# Patient Record
Sex: Male | Born: 1990 | Hispanic: No | Marital: Single | Smoking: Never smoker
Health system: Southern US, Community
[De-identification: ages and names within clinical notes are randomized; demographics above are authoritative.]

## PROBLEM LIST (undated history)

## (undated) ENCOUNTER — Inpatient Hospital Stay (HOSPITAL_COMMUNITY): Payer: No Typology Code available for payment source

## (undated) DIAGNOSIS — I10 Essential (primary) hypertension: Secondary | ICD-10-CM

---

## 2018-03-18 ENCOUNTER — Emergency Department (HOSPITAL_BASED_OUTPATIENT_CLINIC_OR_DEPARTMENT_OTHER)
Admission: EM | Admit: 2018-03-18 | Discharge: 2018-03-18 | Disposition: A | Discharge status: Home / Self Care | Payer: No Typology Code available for payment source | Attending: Emergency Medicine | Admitting: Emergency Medicine

## 2018-03-18 ENCOUNTER — Emergency Department (HOSPITAL_BASED_OUTPATIENT_CLINIC_OR_DEPARTMENT_OTHER): Payer: No Typology Code available for payment source

## 2018-03-18 ENCOUNTER — Encounter (HOSPITAL_BASED_OUTPATIENT_CLINIC_OR_DEPARTMENT_OTHER): Payer: Self-pay | Admitting: Emergency Medicine

## 2018-03-18 ENCOUNTER — Other Ambulatory Visit: Payer: Self-pay

## 2018-03-18 DIAGNOSIS — M542 Cervicalgia: Secondary | ICD-10-CM | POA: Insufficient documentation

## 2018-03-18 DIAGNOSIS — R51 Headache: Secondary | ICD-10-CM | POA: Diagnosis not present

## 2018-03-18 DIAGNOSIS — R0789 Other chest pain: Secondary | ICD-10-CM | POA: Insufficient documentation

## 2018-03-18 DIAGNOSIS — M545 Low back pain, unspecified: Secondary | ICD-10-CM

## 2018-03-18 MED ORDER — ACETAMINOPHEN 500 MG PO TABS
ORAL_TABLET | ORAL | Status: AC
  Administered 2018-03-18: 1000 mg
  Filled 2018-03-18: qty 2

## 2018-03-18 MED ORDER — CYCLOBENZAPRINE HCL 10 MG PO TABS: 10.0000 mg | ORAL_TABLET | Freq: Two times a day (BID) | ORAL | 0 refills | Status: DC | PRN

## 2018-03-18 NOTE — Discharge Instructions (Signed)
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
## 2022-06-16 ENCOUNTER — Encounter (HOSPITAL_COMMUNITY): Payer: Self-pay | Admitting: Emergency Medicine

## 2022-06-16 ENCOUNTER — Ambulatory Visit (HOSPITAL_COMMUNITY)
Admission: EM | Admit: 2022-06-16 | Discharge: 2022-06-16 | Disposition: A | Discharge status: Home / Self Care | Payer: Self-pay | Attending: Internal Medicine | Admitting: Internal Medicine

## 2022-06-16 DIAGNOSIS — I10 Essential (primary) hypertension: Secondary | ICD-10-CM

## 2022-06-16 MED ORDER — AMLODIPINE BESYLATE 5 MG PO TABS: 5.0000 mg | ORAL_TABLET | Freq: Every day | ORAL | 0 refills | Status: DC

## 2022-06-16 NOTE — ED Triage Notes (Signed)
Pt reports a headache and feeling dizzy x 6 months. States he feels like symptoms are related to having high blood pressure due to hands trembling.

## 2022-06-16 NOTE — ED Provider Notes (Signed)
[facility]    CSN: [id number] Arrival date & time: 06/16/22  1626      History   Chief Complaint Chief Complaint  Patient presents with   Headache   Dizziness    HPI Chad Barrett is a 31 y.o. male presents to UC today with complaint of headache and lightheadedness.  He reports this is occurred daily for the past 6 months.  The headache is located all over his head.  He describes the pain as pressure.  He describes the lightheadedness as a sense of imbalance, not that the room is spinning.  He denies vision changes, sinus symptoms, chest pain or shortness of breath.  His BP today is 161/77.  He has never been diagnosed with HTN in the past but reports it does run in his family.  HPI  History reviewed. No pertinent past medical history.  There are no problems to display for this patient.   History reviewed. No pertinent surgical history.     Home Medications    Prior to Admission medications   Medication Sig Start Date End Date Taking? Authorizing Provider  amLODipine (NORVASC) 5 MG tablet Take 1 tablet (5 mg total) by mouth daily. 06/16/22  Yes Stephaine Breshears, Salvadore Oxford, NP  cyclobenzaprine (FLEXERIL) 10 MG tablet Take 1 tablet (10 mg total) by mouth 2 (two) times daily as needed for muscle spasms. 03/18/18   Jeanie Sewer, PA-C    Family History History reviewed. No pertinent family history.  Social History Social History   Tobacco Use   Smoking status: Never   Smokeless tobacco: Never  Substance Use Topics   Alcohol use: Yes    Alcohol/week: 3.0 standard drinks of alcohol    Types: 3 Cans of beer per week   Drug use: Never     Allergies   Patient has no known allergies.   Review of Systems Review of Systems  Constitutional:  Negative for diaphoresis.  HENT:  Negative for sinus pressure and sinus pain.   Eyes:  Negative for visual disturbance.  Respiratory:  Negative for chest tightness and shortness of breath.   Cardiovascular:  Negative for  chest pain.  Neurological:  Positive for light-headedness and headaches. Negative for speech difficulty and weakness.     Physical Exam Triage Vital Signs ED Triage Vitals  Enc Vitals Group     BP 06/16/22 1653 (!) 161/77     Pulse Rate 06/16/22 1653 96     Resp 06/16/22 1653 18     Temp 06/16/22 1653 98.4 F (36.9 C)     Temp Source 06/16/22 1653 Oral     SpO2 06/16/22 1653 98 %     Weight --      Height --      Head Circumference --      Peak Flow --      Pain Score 06/16/22 1655 7     Pain Loc --      Pain Edu? --      Excl. in GC? --    No data found.  Updated Vital Signs BP (!) 161/77 (BP Location: Right Arm)   Pulse 96   Temp 98.4 F (36.9 C) (Oral)   Resp 18   SpO2 98%      Physical Exam Constitutional:      Appearance: He is well-developed.  HENT:     Head: Normocephalic.  Eyes:     Extraocular Movements: Extraocular movements intact.     Right eye: No  nystagmus.     Left eye: No nystagmus.     Pupils: Pupils are equal, round, and reactive to light.  Cardiovascular:     Rate and Rhythm: Normal rate and regular rhythm.  Pulmonary:     Effort: Pulmonary effort is normal.     Breath sounds: Normal breath sounds.  Neurological:     Mental Status: He is alert and oriented to person, place, and time.     Cranial Nerves: No cranial nerve deficit.      UC Treatments / Results  Medications Ordered in UC Medications - No data to display  Initial Impression / Assessment and Plan / UC Course  I have reviewed the triage vital signs and the nursing notes.  Pertinent labs & imaging results that were available during my care of the patient were reviewed by me and considered in my medical decision making (see chart for details).    31 year old male with complaint of headaches and lightheadedness.  Blood pressure is elevated today and he has a strong family history of HTN.  We will start Amlodipine 5 mg daily.  Reassured him that headaches and lightheadedness  should improve as his blood pressure improves.  Ad[facility] to [facility] an appointment for reevaluation.  Encouraged low-salt diet and adequate water intake.  Final Clinical Impressions(s) / UC Diagnoses   Final diagnoses:  Primary hypertension     Discharge Instructions      You were seen today for headaches and lightheadedness.  Your blood pressure is elevated.  We are starting you on blood pressure medication that you should take every day.  You should consume a low-salt diet as salt can raise your blood pressure.  Please schedule an appointment with [facility] in 2 weeks for follow-up on your blood pressure.     ED Prescriptions     Medication Sig Dispense Auth. Provider   amLODipine (NORVASC) 5 MG tablet Take 1 tablet (5 mg total) by mouth daily. 30 tablet Lorre Munroe, NP      PDMP not reviewed this encounter.   Lorre Munroe, NP 06/16/22 1712

## 2022-06-16 NOTE — Discharge Instructions (Signed)
You were seen today for headaches and lightheadedness.  Your blood pressure is elevated.  We are starting you on blood pressure medication that you should take every day.  You should consume a low-salt diet as salt can raise your blood pressure.  Please schedule an appointment with [facility] in 2 weeks for follow-up on your blood pressure.

## 2022-09-23 ENCOUNTER — Encounter (HOSPITAL_COMMUNITY): Payer: Self-pay

## 2022-09-23 ENCOUNTER — Ambulatory Visit (HOSPITAL_COMMUNITY)
Admission: EM | Admit: 2022-09-23 | Discharge: 2022-09-23 | Disposition: A | Discharge status: Home / Self Care | Payer: Self-pay | Attending: Emergency Medicine | Admitting: Emergency Medicine

## 2022-09-23 DIAGNOSIS — R519 Headache, unspecified: Secondary | ICD-10-CM

## 2022-09-23 DIAGNOSIS — G8929 Other chronic pain: Secondary | ICD-10-CM

## 2022-09-23 HISTORY — DX: Essential (primary) hypertension: I10

## 2022-09-23 LAB — CBG MONITORING, ED: Glucose-Capillary: 90 mg/dL (ref 70–99)

## 2022-09-23 MED ORDER — ACETAMINOPHEN 325 MG PO TABS
650.0000 mg | ORAL_TABLET | Freq: Once | ORAL | Status: AC
  Administered 2022-09-23: 650 mg via ORAL

## 2022-09-23 MED ORDER — ACETAMINOPHEN 325 MG PO TABS
ORAL_TABLET | ORAL | Status: AC
  Filled 2022-09-23: qty 2

## 2022-09-23 NOTE — Discharge Instructions (Addendum)
Please follow up with the [facility].  You can continue tylenol 650 mg every 6 hours as needed  Haga un seguimiento con [facility].  Puede continuar con tylenol 650 mg cada 6 horas segn sea necesario

## 2022-09-23 NOTE — ED Triage Notes (Signed)
Patient states that he has a headache, dizziness, and shaking of his hands x 1 year. Patient states he has been prescribed BP meds in the past.  Patient states he has not taken BP meds x 2 months because he was not prescribed more. BP in  triage-128/76.

## 2022-09-23 NOTE — ED Provider Notes (Signed)
[place]    CSN: [id number] Arrival date & time: 09/23/22  [id number]     History   Chief Complaint Chief Complaint  Patient presents with   Headache   Dizziness    HPI Chad Barrett is a 32 y.o. male.  Medical interpretor used for this encounter  Here with 1 year history of headache and dizziness Comes and goes Today head pain is 9/10  He was seen 4 months ago for similar, had elevated BP (systolic [id number]), and was prescribed amlodipine. Took for 1 month and ran out. Reports medicine didn't help headaches  Denies head injury or trauma. No weakness of extremities. Denies neck or back pain. Has not taken any medications Does not have a primary care  Past Medical History:  Diagnosis Date   Hypertension     There are no problems to display for this patient.   History reviewed. No pertinent surgical history.   Home Medications    Prior to Admission medications   Medication Sig Start Date End Date Taking? Authorizing Provider  amLODipine (NORVASC) 5 MG tablet Take 1 tablet (5 mg total) by mouth daily. 06/16/22   Jearld Fenton, NP    Family History Family History  Problem Relation Age of Onset   Hypertension Mother     Social History Social History   Tobacco Use   Smoking status: Never   Smokeless tobacco: Never  Vaping Use   Vaping Use: Never used  Substance Use Topics   Alcohol use: Not Currently    Alcohol/week: 3.0 standard drinks of alcohol    Types: 3 Cans of beer per week   Drug use: Not Currently     Allergies   Patient has no known allergies.   Review of Systems Review of Systems As per HPI  Physical Exam Triage Vital Signs ED Triage Vitals  Enc Vitals Group     BP 09/23/22 0904 128/76     Pulse Rate 09/23/22 0904 80     Resp 09/23/22 0904 16     Temp 09/23/22 0904 98.6 F (37 C)     Temp Source 09/23/22 0904 Oral     SpO2 09/23/22 0904 96 %     Weight --      Height --      Head Circumference --      Peak Flow  --      Pain Score 09/23/22 0907 9     Pain Loc --      Pain Edu? --      Excl. in [place]? --    No data found.  Updated Vital Signs BP 128/76 (BP Location: Left Arm)   Pulse 80   Temp 98.6 F (37 C) (Oral)   Resp 16   SpO2 96%    Physical Exam Vitals and nursing note reviewed.  Constitutional:      General: He is not in acute distress.    Appearance: Normal appearance.  HENT:     Head: Normocephalic and atraumatic.     Mouth/Throat:     Mouth: Mucous membranes are moist.     Pharynx: Oropharynx is clear.  Eyes:     Extraocular Movements: Extraocular movements intact.     Conjunctiva/sclera: Conjunctivae normal.     Pupils: Pupils are equal, round, and reactive to light.  Cardiovascular:     Rate and Rhythm: Normal rate and regular rhythm.     Pulses: Normal pulses.     Heart sounds: Normal heart sounds.  Pulmonary:     Effort: Pulmonary effort is normal.     Breath sounds: Normal breath sounds.  Musculoskeletal:        General: Normal range of motion.     Cervical back: Normal range of motion. No rigidity or tenderness.  Neurological:     General: No focal deficit present.     Mental Status: He is alert and oriented to person, place, and time.     Cranial Nerves: Cranial nerves 2-12 are intact.     Sensory: Sensation is intact.     Motor: Motor function is intact. No weakness, tremor or pronator drift.     Coordination: Coordination is intact. Finger-Nose-Finger Test normal.     Gait: Gait is intact.     Comments: Strength and sensation intact throughout      UC Treatments / Results  Labs (all labs ordered are listed, but only abnormal results are displayed) Labs Reviewed  CBG MONITORING, ED    EKG  Radiology No results found.  Procedures Procedures (including critical care time)  Medications Ordered in UC Medications  acetaminophen (TYLENOL) tablet 650 mg (650 mg Oral Given 09/23/22 0920)    Initial Impression / Assessment and Plan / UC Course  I  have reviewed the triage vital signs and the nursing notes.  Pertinent labs & imaging results that were available during my care of the patient were reviewed by me and considered in my medical decision making (see chart for details).  Normotensive 128/76 today. No indication to restart BP meds at this time, caution for becoming hypotensive. CBG 90 Neuro exam intact. Very well appearing  He does report improvement with tylenol dose given Patient without insurance. Provided community [facility] wellness [facility] info, patient called in clinic to set up appointment - 3 weeks from today Return precautions discussed. Patient agrees to plan  Final Clinical Impressions(s) / UC Diagnoses   Final diagnoses:  Chronic nonintractable headache, unspecified headache type     Discharge Instructions      Please follow up with the [facility].  You can continue tylenol 650 mg every 6 hours as needed  Haga un seguimiento con la [facility].  Puede continuar con tylenol 650 mg cada 6 horas segn sea necesario     ED Prescriptions   None    PDMP not reviewed this encounter.   Kamaryn Grimley, Wells Guiles, PA-C 09/23/22 1018

## 2022-10-14 ENCOUNTER — Ambulatory Visit: Payer: Self-pay | Attending: Internal Medicine | Admitting: Internal Medicine

## 2022-10-14 ENCOUNTER — Encounter: Payer: Self-pay | Admitting: Internal Medicine

## 2022-10-14 ENCOUNTER — Other Ambulatory Visit: Payer: Self-pay

## 2022-10-14 VITALS — BP 117/73 | HR 96 | Temp 98.5°F | Ht 66.0 in | Wt 154.0 lb

## 2022-10-14 DIAGNOSIS — R519 Headache, unspecified: Secondary | ICD-10-CM

## 2022-10-14 DIAGNOSIS — Z7689 Persons encountering health services in other specified circumstances: Secondary | ICD-10-CM

## 2022-10-14 MED ORDER — TOPIRAMATE 25 MG PO TABS
ORAL_TABLET | ORAL | 1 refills | Status: DC
  Filled 2022-10-14: qty 60, 37d supply, fill #0
  Filled 2022-11-13: qty 60, 37d supply, fill #1

## 2022-10-14 NOTE — Progress Notes (Signed)
Patient ID: Chad Barrett, male    DOB: 29-Jun-1991  MRN: [id number]  CC: Establish Care (Est care / new patient. [facility] meds/Intermittent dizziness, shaky hands, headaches X2 mo/Already received flu vax at pharmacy. )   Subjective: Chad Barrett is a 32 y.o. male who presents for new pt visit His concerns today include:   [employer] Language interpreter used during this encounter. [id number], Levada Dy  No previous PCP. Pt to est care Also presents as f/u from UC visit 09/23/2022 where he was seen for HA and dizziness x 1 yr.  Was seen 06/16/22 for same.  BP was elev at that time.  He was placed on Norvasc for 1 mth which he took.    Main concern today is HA x 5 mths.  He thinks it is his BP; no device at home to check BP.  Last BP check at UC earlier this mth was 128/76 and he was off Norvasc Frequency:  daily Character: both temples; stabbing. Comes on suddenly; can last 2 hrs and sometimes longer.  No particular time of the day.   Associated symptoms: dizziness, nausea sometimes, and shaking in hands.  Endorses blurred vision.  No photophobia.  No congestion or redness to eyes.  Feels better laying down than up moving around.  Feels desperate and anxious because of what is going on.  "I don't feel comfortable with myself."  No SI.   Meds:  takes Tylenol 2 tabs but not often.  Does help. HA resolves after 1/2 hr of taking Tylenol Does not drink ETOH beverages because they make him not feel well.  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    No current outpatient medications on file prior to visit.   No current facility-administered medications on file prior to visit.    No Known Allergies  Social History   Socioeconomic History   Marital status: Single    Spouse name: Not on file   Number of children: Not on file   Years of education: Not on file   Highest education level: Not on file  Occupational History   Not  on file  Tobacco Use   Smoking status: Never   Smokeless tobacco: Never  Vaping Use   Vaping Use: Never used  Substance and Sexual Activity   Alcohol use: Not Currently    Alcohol/week: 3.0 standard drinks of alcohol    Types: 3 Cans of beer per week   Drug use: Not Currently   Sexual activity: Not on file  Other Topics Concern   Not on file  Social History Narrative   Not on file   Social Determinants of Health   Financial Resource Strain: Not on file  Food Insecurity: Not on file  Transportation Needs: Not on file  Physical Activity: Not on file  Stress: Not on file  Social Connections: Not on file  Intimate Partner Violence: Not on file    Family History  Problem Relation Age of Onset   Hypertension Mother    Healthy Father    Heart Problems Paternal Grandfather     History reviewed. No pertinent surgical history.  ROS: Review of Systems Negative except as stated above  PHYSICAL EXAM: BP 117/73 (BP Location: Left Arm, Patient Position: Sitting, Cuff Size: Normal)   Pulse 96   Temp 98.5 F (36.9 C) (Oral)   Ht 5\' 6"  (1.676 m)   Wt 154 lb (  69.9 kg)   SpO2 100%   BMI 24.86 kg/m   Physical Exam   General appearance - alert, well appearing, and in no distress Mental status - normal mood, behavior, speech, dress, motor activity, and thought processes Neck - supple, no significant adenopathy Chest - clear to auscultation, no wheezes, rales or rhonchi, symmetric air entry Heart - normal rate, regular rhythm, normal S1, S2, no murmurs, rubs, clicks or gallops Neurological - cranial nerves II through XII intact, motor and sensory grossly normal bilaterally, Romberg sign negative, normal gait and station.  No coarse tremors in hands.  Slight tremor in hands at times but would not classify as intention tremor Mild tenderness on palpation over the temporal areas.  Temporal arteries prominent without tenderness    10/14/2022    9:02 AM  Depression screen PHQ 2/9   Decreased Interest 3  Down, Depressed, Hopeless 0  PHQ - 2 Score 3  Altered sleeping 0  Tired, decreased energy 0  Change in appetite 0  Feeling bad or failure about yourself  0  Trouble concentrating 0  Moving slowly or fidgety/restless 1  Suicidal thoughts 0  PHQ-9 Score 4      10/14/2022    9:05 AM  GAD 7 : Generalized Anxiety Score  Nervous, Anxious, on Edge 1  Control/stop worrying 1  Worry too much - different things 1  Trouble relaxing 1  Restless 1  Easily annoyed or irritable 0  Afraid - awful might happen 1  Total GAD 7 Score 6         No data to display         ASSESSMENT AND PLAN: 1. Establishing care with new doctor, encounter for   2. Chronic daily headache Questionable chronic migraine.  Does not sound like cluster HA Since he reports good relief with Tylenol and has not been using that every day, will have him use Tylenol 325 mg 2 tablets as needed for abortive therapy.  Start Topamax at bedtime. Will check sed rate and C-reactive protein. Given information to apply for the orange card/cone discount in the event we need to refer to neurology. - topiramate (TOPAMAX) 25 MG tablet; Take 1 tablet (25 mg) by mouth at bedtime x 2 weeks then 2 tablets (50 mg) by mouth nightly at bedtime  Dispense: 60 tablet; Refill: 1 - CBC - Comprehensive metabolic panel - Sedimentation Rate - C-reactive protein   Patient was given the opportunity to ask questions.  Patient verbalized understanding of the plan and was able to repeat key elements of the plan.   This documentation was completed using Radio producer.  Any transcriptional errors are unintentional.  Orders Placed This Encounter  Procedures   CBC   Comprehensive metabolic panel   Sedimentation Rate   C-reactive protein     Requested Prescriptions   Signed Prescriptions Disp Refills   topiramate (TOPAMAX) 25 MG tablet 60 tablet 1    Sig: Take 1 tablet (25 mg) by mouth at  bedtime x 2 weeks then 2 tablets (50 mg) by mouth nightly at bedtime    Return in about 6 weeks (around 11/25/2022).  Karle Plumber, MD, FACP

## 2022-10-15 LAB — CBC
Hematocrit: 51.7 % — ABNORMAL HIGH (ref 37.5–51.0)
Hemoglobin: 17.3 g/dL (ref 13.0–17.7)
MCH: 27.9 pg (ref 26.6–33.0)
MCHC: 33.5 g/dL (ref 31.5–35.7)
MCV: 83 fL (ref 79–97)
Platelets: 309 10*3/uL (ref 150–450)
RBC: 6.2 x10E6/uL — ABNORMAL HIGH (ref 4.14–5.80)
RDW: 13.1 % (ref 11.6–15.4)
WBC: 5.3 10*3/uL (ref 3.4–10.8)

## 2022-10-15 LAB — COMPREHENSIVE METABOLIC PANEL
ALT: 28 IU/L (ref 0–44)
AST: 21 IU/L (ref 0–40)
Albumin/Globulin Ratio: 1.8 (ref 1.2–2.2)
Albumin: 4.9 g/dL (ref 4.3–5.2)
Alkaline Phosphatase: 78 IU/L (ref 44–121)
BUN/Creatinine Ratio: 18 (ref 9–20)
BUN: 17 mg/dL (ref 6–20)
Bilirubin Total: 0.7 mg/dL (ref 0.0–1.2)
CO2: 20 mmol/L (ref 20–29)
Calcium: 9.8 mg/dL (ref 8.7–10.2)
Chloride: 102 mmol/L (ref 96–106)
Creatinine, Ser: 0.94 mg/dL (ref 0.76–1.27)
Globulin, Total: 2.7 g/dL (ref 1.5–4.5)
Glucose: 97 mg/dL (ref 70–99)
Potassium: 4.4 mmol/L (ref 3.5–5.2)
Sodium: 139 mmol/L (ref 134–144)
Total Protein: 7.6 g/dL (ref 6.0–8.5)
eGFR: 112 mL/min/{1.73_m2} (ref >59)

## 2022-10-15 LAB — SEDIMENTATION RATE: Sed Rate: 2 mm/hr (ref 0–15)

## 2022-10-15 LAB — C-REACTIVE PROTEIN: CRP: <1 mg/L (ref 0–10)

## 2022-10-21 ENCOUNTER — Encounter: Payer: Self-pay | Admitting: *Deleted

## 2022-10-25 ENCOUNTER — Telehealth: Payer: Self-pay | Admitting: *Deleted

## 2022-10-25 ENCOUNTER — Telehealth: Payer: Self-pay

## 2022-10-25 NOTE — Telephone Encounter (Signed)
Interpreter Thyra Breed ID [phone] used to review lab results.  Pt given lab results per notes of Dr. Laural Benes from 10/16/22 on 10/25/22. Pt verbalized understanding normal kidney and liver tests and test for inflammation normal. And RBC elevated and will discuss further on subsequent visit. Patient reports since starting medication topamax he is experiencing tingling in fingers and dizziness with some abdominal discomfort. Do you want patient to continue taking the medication? Please advise. Patient reports he does not have VM due to so many scam calls and will call back this pm for PCP response.

## 2022-11-13 ENCOUNTER — Other Ambulatory Visit: Payer: Self-pay

## 2022-11-29 ENCOUNTER — Ambulatory Visit: Payer: Self-pay | Attending: Internal Medicine | Admitting: Internal Medicine

## 2022-11-29 ENCOUNTER — Encounter: Payer: Self-pay | Admitting: Internal Medicine

## 2022-11-29 ENCOUNTER — Other Ambulatory Visit: Payer: Self-pay

## 2022-11-29 VITALS — BP 117/74 | HR 73 | Temp 98.4°F | Ht 66.0 in | Wt 153.0 lb

## 2022-11-29 DIAGNOSIS — F411 Generalized anxiety disorder: Secondary | ICD-10-CM

## 2022-11-29 DIAGNOSIS — R519 Headache, unspecified: Secondary | ICD-10-CM

## 2022-11-29 MED ORDER — SERTRALINE HCL 25 MG PO TABS
25.0000 mg | ORAL_TABLET | Freq: Every day | ORAL | 1 refills | Status: DC
  Filled 2022-11-29: qty 30, 30d supply, fill #0
  Filled 2022-12-24: qty 30, 30d supply, fill #1

## 2022-11-29 NOTE — Progress Notes (Signed)
Patient ID: Chad Barrett, male    DOB: [birth year]  MRN: [id number]  CC: Follow-up (Follow up. /Reports that medication ( topiramate) has not been helping. Pt reports that stress may be contributing to dizziness. Not seen family in 17 yrs. /Reports taking meds consistently )   Subjective: Chad Barrett is a 32 y.o. male who presents for follow-up on headache. His concerns today include:   AMN Language interpreter used during this encounter. #[id number] Mariana Kaufman    Visit 10/14/2022: Main concern today is HA x 5 mths.  He thinks it is his BP; no device at home to check BP.  Last BP check at UC earlier this mth was 128/76 and he was off Norvasc Frequency:  daily Character: both temples; stabbing. Comes on suddenly; can last 2 hrs and sometimes longer.  No particular time of the day.   Associated symptoms: dizziness, nausea sometimes, and shaking in hands.  Endorses blurred vision.  No photophobia.  No congestion or redness to eyes.  Feels better laying down than up moving around.  Feels desperate and anxious because of what is going on.  "I don't feel comfortable with myself."  No SI.   Meds:  takes Tylenol 2 tabs but not often.  Does help. HA resolves after 1/2 hr of taking Tylenol Does not drink ETOH beverages because they make him not feel well.  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  Advised to use Tylenol 325 mg 2 tablets as needed for abortive therapy.  He was started on Topamax 25 mg and told to increase to 50 mg after 2 weeks.  C-reactive protein and sed rate were normal.  Today: Reports that Topamax has not helped.  He feels his symptoms may be worsened by stress.  Parents live in [place]; wants to see them but has not been able to due to lack of proper legal status.  Has not seen them in 17 yrs.  Has a daughter here and if he goes to [place] he is afraid he would not be able to reenter the [place]  to see daughter.  Feels anxious for yrs,  GAD7 score today is 12.  Reports depression not a major issue.  PHQ9 score today is 7.  No SI. Marland Kitchen        Current Outpatient Medications on File Prior to Visit  Medication Sig Dispense Refill   topiramate (TOPAMAX) 25 MG tablet Take 1 tablet (25 mg) by mouth at bedtime x 2 weeks then 2 tablets (50 mg) by mouth nightly at bedtime 60 tablet 1   No current facility-administered medications on file prior to visit.    No Known Allergies  Social History   Socioeconomic History   Marital status: Single    Spouse name: Not on file   Number of children: Not on file   Years of education: Not on file   Highest education level: Not on file  Occupational History   Not on file  Tobacco Use   Smoking status: Never   Smokeless tobacco: Never  Vaping Use   Vaping Use: Never used  Substance and Sexual Activity   Alcohol use: Not Currently    Alcohol/week: 3.0 standard drinks of alcohol    Types: 3 Cans of beer per week   Drug use: Not Currently   Sexual activity: Not on file  Other Topics Concern   Not on file  Social History Narrative   Not on file   Social Determinants of Health   Financial Resource Strain: Not on file  Food Insecurity: Not on file  Transportation Needs: Not on file  Physical Activity: Not on file  Stress: Not on file  Social Connections: Not on file  Intimate Partner Violence: Not on file    Family History  Problem Relation Age of Onset   Hypertension Mother    Healthy Father    Heart Problems Paternal Grandfather     No past surgical history on file.  ROS: Review of Systems Negative except as stated above  PHYSICAL EXAM: BP 117/74 (BP Location: Left Arm, Patient Position: Sitting, Cuff Size: Normal)   Pulse 73   Temp 98.4 F (36.9 C) (Oral)   Ht 5\' 6"  (1.676 m)   Wt 153 lb (69.4 kg)   SpO2 100%   BMI 24.69 kg/m   Physical Exam   General appearance - alert, well appearing, and in no distress Mental  status - normal mood, behavior, speech, dress, motor activity, and thought processes     Latest Ref Rng & Units 10/14/2022    9:50 AM  CMP  Glucose 70 - 99 mg/dL 97   BUN 6 - 20 mg/dL 17   Creatinine 1.61 - 1.27 mg/dL 0.96   Sodium 045 - 409 mmol/L 139   Potassium 3.5 - 5.2 mmol/L 4.4   Chloride 96 - 106 mmol/L 102   CO2 20 - 29 mmol/L 20   Calcium 8.7 - 10.2 mg/dL 9.8   Total Protein 6.0 - 8.5 g/dL 7.6   Total Bilirubin 0.0 - 1.2 mg/dL 0.7   Alkaline Phos 44 - 121 IU/L 78   AST 0 - 40 IU/L 21   ALT 0 - 44 IU/L 28    Lipid Panel  No results found for: "CHOL", "TRIG", "HDL", "CHOLHDL", "VLDL", "LDLCALC", "LDLDIRECT"  CBC    Component Value Date/Time   WBC 5.3 10/14/2022 0950   RBC 6.20 (H) 10/14/2022 0950   HGB 17.3 10/14/2022 0950   HCT 51.7 (H) 10/14/2022 0950   PLT 309 10/14/2022 0950   MCV 83 10/14/2022 0950   MCH 27.9 10/14/2022 0950   MCHC 33.5 10/14/2022 0950   RDW 13.1 10/14/2022 0950    ASSESSMENT AND PLAN:  1. GAD (generalized anxiety disorder) Related to stressful life situation. I advised patient that he can check with [facility] [employer] to see if they can assist him work towards getting legal status -He would like to try medication to help with his anxiety and mood.  We discussed putting him on Zoloft starting at a low-dose of 25 mg daily.  Advised that if he has increased anxiety or depression on the medication he should stop the medicine and let us know.  Other possible side effects include GI upset, drowsiness, delayed ejaculation.  Advised that if the medication causes tiredness or drowsiness, he should take it in the evenings.  He declines referral for counseling.  We will have him follow-up in about 6 to 8 weeks to see how he is doing on the medicine. Will see whether his headaches improve with treatment of the anxiety.  If not, we will refer to an neurology.  He will need to get approved for the cone discount. - sertraline (ZOLOFT) 25 MG  tablet; Take 1 tablet (25 mg total) by mouth daily.  Dispense: 30 tablet; Refill: 1  2. Chronic daily headache See #1 above.  Patient was given the opportunity to ask questions.  Patient verbalized understanding of the plan and was able to repeat key elements of the plan.   This documentation was completed using Radio producer.  Any transcriptional errors are unintentional.  No orders of the defined types were placed in this encounter.    Requested Prescriptions    No prescriptions requested or ordered in this encounter    No follow-ups on file.  Karle Plumber, MD, FACP

## 2022-12-24 ENCOUNTER — Other Ambulatory Visit: Payer: Self-pay

## 2022-12-24 ENCOUNTER — Other Ambulatory Visit: Payer: Self-pay | Admitting: Internal Medicine

## 2022-12-24 DIAGNOSIS — R519 Headache, unspecified: Secondary | ICD-10-CM

## 2022-12-24 MED ORDER — TOPIRAMATE 25 MG PO TABS
50.0000 mg | ORAL_TABLET | Freq: Every day | ORAL | 0 refills | Status: DC
  Filled 2022-12-24: qty 180, 90d supply, fill #0

## 2022-12-25 ENCOUNTER — Other Ambulatory Visit: Payer: Self-pay

## 2023-01-01 ENCOUNTER — Other Ambulatory Visit: Payer: Self-pay

## 2023-01-13 NOTE — Telephone Encounter (Signed)
Discard

## 2023-01-29 ENCOUNTER — Other Ambulatory Visit: Payer: Self-pay | Admitting: Pharmacist

## 2023-01-29 ENCOUNTER — Other Ambulatory Visit: Payer: Self-pay

## 2023-01-29 DIAGNOSIS — F411 Generalized anxiety disorder: Secondary | ICD-10-CM

## 2023-01-29 MED ORDER — SERTRALINE HCL 25 MG PO TABS
25.0000 mg | ORAL_TABLET | Freq: Every day | ORAL | 0 refills | Status: DC
  Filled 2023-01-29: qty 30, 30d supply, fill #0

## 2023-02-06 ENCOUNTER — Other Ambulatory Visit: Payer: Self-pay

## 2023-02-06 ENCOUNTER — Ambulatory Visit: Payer: Self-pay | Attending: Internal Medicine | Admitting: Internal Medicine

## 2023-02-06 ENCOUNTER — Encounter: Payer: Self-pay | Admitting: Internal Medicine

## 2023-02-06 VITALS — BP 128/71 | HR 62 | Temp 98.1°F | Ht 66.0 in | Wt 159.0 lb

## 2023-02-06 DIAGNOSIS — Z1331 Encounter for screening for depression: Secondary | ICD-10-CM

## 2023-02-06 DIAGNOSIS — L659 Nonscarring hair loss, unspecified: Secondary | ICD-10-CM

## 2023-02-06 DIAGNOSIS — F411 Generalized anxiety disorder: Secondary | ICD-10-CM

## 2023-02-06 DIAGNOSIS — G4489 Other headache syndrome: Secondary | ICD-10-CM

## 2023-02-06 MED ORDER — SERTRALINE HCL 50 MG PO TABS
50.0000 mg | ORAL_TABLET | Freq: Every day | ORAL | 2 refills | Status: DC
  Filled 2023-02-06: qty 30, 30d supply, fill #0
  Filled 2023-03-06: qty 30, 30d supply, fill #1
  Filled 2023-04-18: qty 30, 30d supply, fill #2

## 2023-02-06 MED ORDER — HYDROXYZINE PAMOATE 25 MG PO CAPS
25.0000 mg | ORAL_CAPSULE | Freq: Two times a day (BID) | ORAL | 1 refills | Status: DC | PRN
  Filled 2023-02-06: qty 30, 15d supply, fill #0

## 2023-02-06 NOTE — Progress Notes (Signed)
Patient ID: Chad Barrett, male    DOB: 1991-02-26  MRN: [id number]  CC: Follow-up (F/u. Med refill. Herold Harms a higher dose - meds are not as effective/Experiencing restlessness, dizziness, hair loss )   Subjective: Chad Barrett is a 32 y.o. male who presents for 2 mth f/u His concerns today include:  Pt with hx of SUD (cocaine/meth d/c 2022), GAD, HA syndrome, migraine  [employer] Language interpreter used during this encounter. Chad Barrett [id number]  My 1st yr med student, Leonor Liv, participated in this encounter by taking history.    Pt was started on Zoloft 25 mg daily for GAD.  He was having chronic daily headaches that were worsened by stress/anxiety. Today he reports feeling better after taking the Zoloft for a full month.  Headaches have decreased in frequency from daily to about twice a week.  Still gets dizziness and anxious feelings but not as bad as prior to being on the medication.  Denies any significant depression or suicidal ideation.  He has been clean of street drugs for 2 years and states that he does not crave it but is afraid of it.  Still has friends who use and come around.  They offer him drugs but states he has been quite firm and refusing it.  Concern of hair thinning on the crown of the head.  States that it was noticed by the lady who cuts his hair.  He is not aware of any male pattern baldness in his family.  Does not notice any hair coming out in comb or brush. There are no problems to display for this patient.    No current outpatient medications on file prior to visit.   No current facility-administered medications on file prior to visit.    No Known Allergies  Social History   Socioeconomic History   Marital status: Single    Spouse name: Not on file   Number of children: Not on file   Years of education: Not on file   Highest education level: Not on file  Occupational History   Not on file  Tobacco Use   Smoking status: Never    Smokeless tobacco: Never  Vaping Use   Vaping status: Never Used  Substance and Sexual Activity   Alcohol use: Not Currently    Alcohol/week: 3.0 standard drinks of alcohol    Types: 3 Cans of beer per week   Drug use: Not Currently   Sexual activity: Not on file  Other Topics Concern   Not on file  Social History Narrative   Not on file   Social Determinants of Health   Financial Resource Strain: Not on file  Food Insecurity: Not on file  Transportation Needs: Not on file  Physical Activity: Not on file  Stress: Not on file  Social Connections: Not on file  Intimate Partner Violence: Not on file    Family History  Problem Relation Age of Onset   Hypertension Mother    Healthy Father    Heart Problems Paternal Grandfather     No past surgical history on file.  ROS: Review of Systems Negative except as stated above  PHYSICAL EXAM: BP 128/71 (BP Location: Left Arm, Patient Position: Sitting, Cuff Size: Normal)   Pulse 62   Temp 98.1 F (36.7 C) (Oral)   Ht 5\' 6"  (1.676 m)   Wt 159 lb (72.1 kg)   SpO2 100%   BMI 25.66 kg/m   Physical Exam   General appearance -  alert, well appearing, and in no distress Chest - clear to auscultation, no wheezes, rales or rhonchi, symmetric air entry Heart - normal rate, regular rhythm, normal S1, S2, no murmurs, rubs, clicks or gallops Skin -hair: He has mild thinning noted on the crown of the head.     02/06/2023   10:59 AM 11/29/2022   11:09 AM 10/14/2022    9:05 AM  GAD 7 : Generalized Anxiety Score  Nervous, Anxious, on Edge 1 3 1   Control/stop worrying 2 2 1   Worry too much - different things 1 2 1   Trouble relaxing 1 2 1   Restless 1 1 1   Easily annoyed or irritable 1 1 0  Afraid - awful might happen 0 1 1  Total GAD 7 Score 7 12 6        02/06/2023    9:03 AM 11/29/2022   11:09 AM 10/14/2022    9:02 AM  Depression screen PHQ 2/9  Decreased Interest 2 2 3   Down, Depressed, Hopeless 1 1 0  PHQ - 2 Score 3 3 3    Altered sleeping 2 0 0  Tired, decreased energy 1 2 0  Change in appetite 2 0 0  Feeling bad or failure about yourself  0 0 0  Trouble concentrating 0 1 0  Moving slowly or fidgety/restless 2 1 1   Suicidal thoughts 0 0 0  PHQ-9 Score 10 7 4        Latest Ref Rng & Units 10/14/2022    9:50 AM  CMP  Glucose 70 - 99 mg/dL 97   BUN 6 - 20 mg/dL 17   Creatinine 4.09 - 1.27 mg/dL 8.11   Sodium 914 - 782 mmol/L 139   Potassium 3.5 - 5.2 mmol/L 4.4   Chloride 96 - 106 mmol/L 102   CO2 20 - 29 mmol/L 20   Calcium 8.7 - 10.2 mg/dL 9.8   Total Protein 6.0 - 8.5 g/dL 7.6   Total Bilirubin 0.0 - 1.2 mg/dL 0.7   Alkaline Phos 44 - 121 IU/L 78   AST 0 - 40 IU/L 21   ALT 0 - 44 IU/L 28    Lipid Panel  No results found for: "CHOL", "TRIG", "HDL", "CHOLHDL", "VLDL", "LDLCALC", "LDLDIRECT"  CBC    Component Value Date/Time   WBC 5.3 10/14/2022 0950   RBC 6.20 (H) 10/14/2022 0950   HGB 17.3 10/14/2022 0950   HCT 51.7 (H) 10/14/2022 0950   PLT 309 10/14/2022 0950   MCV 83 10/14/2022 0950   MCH 27.9 10/14/2022 0950   MCHC 33.5 10/14/2022 0950   RDW 13.1 10/14/2022 0950    ASSESSMENT AND PLAN:  1. GAD (generalized anxiety disorder) Overall improved but still not as controlled as we would like.  He is agreeable for [place] to increase the dose of the Zoloft to 50 mg daily. Add low-dose of hydroxyzine to take twice a day as needed.  Advised that the medication can cause some drowsiness.  Follow-up in 2 months. - sertraline (ZOLOFT) 50 MG tablet; Take 1 tablet (50 mg total) by mouth daily.  Dispense: 30 tablet; Refill: 2  2. Positive depression screening Positive PHQ-9 but patient states this is not a major issue for him at this time.  In any event if there is underlying depression, Zoloft will help with this.  3. Headache syndrome Improved since being on Zoloft. Advised to apply for [facility] financial/discount in the event we need to refer to specialist  4. Hair loss We will check  thyroid  level and give referral to dermatology.  Advised of the importance of applying for the [employer] discount. - TSH+T4F+T3Free - Ambulatory referral to Dermatology     Patient was given the opportunity to ask questions.  Patient verbalized understanding of the plan and was able to repeat key elements of the plan.   This documentation was completed using Paediatric nurse.  Any transcriptional errors are unintentional.  Orders Placed This Encounter  Procedures   TSH+T4F+T3Free   Ambulatory referral to Dermatology     Requested Prescriptions   Signed Prescriptions Disp Refills   sertraline (ZOLOFT) 50 MG tablet 30 tablet 2    Sig: Take 1 tablet (50 mg total) by mouth daily.   hydrOXYzine (VISTARIL) 25 MG capsule 30 capsule 1    Sig: Take 1 capsule (25 mg total) by mouth 2 (two) times daily as needed.    Return in about 2 months (around 04/09/2023).  [name], MD, FACP

## 2023-02-07 LAB — TSH+T4F+T3FREE
Free T4: 1.06 ng/dL (ref 0.82–1.77)
T3, Free: 3.4 pg/mL (ref 2.0–4.4)
TSH: 1.29 u[IU]/mL (ref 0.450–4.500)

## 2023-02-19 ENCOUNTER — Telehealth: Payer: Self-pay | Admitting: *Deleted

## 2023-02-19 NOTE — Telephone Encounter (Signed)
Pt given lab results via interpreter Daniella ID # [phone] per notes of Dr. Laural Benes from 02/07/23 on 02/19/23. Pt verbalized understanding. Thyroid hormone levels are normal

## 2023-03-06 ENCOUNTER — Other Ambulatory Visit: Payer: Self-pay

## 2023-03-10 ENCOUNTER — Other Ambulatory Visit: Payer: Self-pay

## 2023-04-18 ENCOUNTER — Other Ambulatory Visit: Payer: Self-pay

## 2023-04-21 ENCOUNTER — Other Ambulatory Visit: Payer: Self-pay

## 2023-05-14 ENCOUNTER — Other Ambulatory Visit: Payer: Self-pay

## 2023-07-11 ENCOUNTER — Encounter: Payer: Self-pay | Admitting: Internal Medicine

## 2023-07-11 ENCOUNTER — Ambulatory Visit: Payer: Self-pay | Attending: Internal Medicine | Admitting: Internal Medicine

## 2023-07-11 ENCOUNTER — Other Ambulatory Visit: Payer: Self-pay

## 2023-07-11 VITALS — BP 120/69 | HR 68 | Ht 66.0 in | Wt 160.0 lb

## 2023-07-11 DIAGNOSIS — Z114 Encounter for screening for human immunodeficiency virus [HIV]: Secondary | ICD-10-CM

## 2023-07-11 DIAGNOSIS — Z23 Encounter for immunization: Secondary | ICD-10-CM

## 2023-07-11 DIAGNOSIS — Z1159 Encounter for screening for other viral diseases: Secondary | ICD-10-CM

## 2023-07-11 DIAGNOSIS — F411 Generalized anxiety disorder: Secondary | ICD-10-CM

## 2023-07-11 MED ORDER — SERTRALINE HCL 50 MG PO TABS
ORAL_TABLET | ORAL | 0 refills | Status: DC
  Filled 2023-07-11: qty 15, 30d supply, fill #0
  Filled 2023-08-05: qty 15, 15d supply, fill #1
  Filled 2023-08-05: qty 30, 30d supply, fill #1

## 2023-07-11 MED ORDER — HYDROXYZINE PAMOATE 25 MG PO CAPS
25.0000 mg | ORAL_CAPSULE | Freq: Two times a day (BID) | ORAL | 3 refills | Status: AC | PRN
  Filled 2023-07-11: qty 60, 30d supply, fill #0

## 2023-07-11 NOTE — Progress Notes (Signed)
Patient ID: Chad Barrett, male    DOB: July 05, 1991  MRN: [id number]  CC: Medical Management of Chronic Issues   Subjective: Chad Barrett is a 32 y.o. male who presents for chronic ds management. His concerns today include:  Pt with hx of SUD (cocaine/meth d/c 2022), GAD, HA syndrome, migraine   [employer] Language interpreter used during this encounter. #[name] [id number]  Presents for f/u GAD On last visit 6 mths ago, we increase Zoloft to 50 mg daily and added Hydroxyzine to use PRN.  Pt reports he ran out several mths ago and did not have a f/u appt to get refills.  Reports he has been struggling since then.  Feels desperate a lot and dizziness at times. No auditory or visual hallucinations.  No thoughts of wanting to hurt self.   He has remained free of street drugs. Yes to flu and Tdapt vaccine  No current outpatient medications on file prior to visit.   No current facility-administered medications on file prior to visit.    No Known Allergies  Social History   Socioeconomic History   Marital status: Single    Spouse name: Not on file   Number of children: Not on file   Years of education: Not on file   Highest education level: Not on file  Occupational History   Not on file  Tobacco Use   Smoking status: Never   Smokeless tobacco: Never  Vaping Use   Vaping status: Never Used  Substance and Sexual Activity   Alcohol use: Not Currently    Alcohol/week: 3.0 standard drinks of alcohol    Types: 3 Cans of beer per week   Drug use: Not Currently   Sexual activity: Not on file  Other Topics Concern   Not on file  Social History Narrative   Not on file   Social Drivers of Health   Financial Resource Strain: Not on file  Food Insecurity: Not on file  Transportation Needs: Not on file  Physical Activity: Not on file  Stress: Not on file  Social Connections: Not on file  Intimate Partner Violence: Not on file    Family History  Problem Relation Age of Onset    Hypertension Mother    Healthy Father    Heart Problems Paternal Grandfather     History reviewed. No pertinent surgical history.  ROS: Review of Systems Negative except as stated above  PHYSICAL EXAM: BP 120/69 (BP Location: Left Arm, Patient Position: Sitting, Cuff Size: Normal)   Pulse 68   Ht 5\' 6"  (1.676 m)   Wt 160 lb (72.6 kg)   SpO2 100%   BMI 25.82 kg/m   Physical Exam   General appearance - alert, well appearing, and in no distress Mental status - normal mood, behavior, speech, dress, motor activity, and thought processes Neck - supple, no significant adenopathy Chest - clear to auscultation, no wheezes, rales or rhonchi, symmetric air entry Heart - normal rate, regular rhythm, normal S1, S2, no murmurs, rubs, clicks or gallops Extremities - peripheral pulses normal, no pedal edema, no clubbing or cyanosis    02/06/2023    9:03 AM 11/29/2022   11:09 AM 10/14/2022    9:02 AM  Depression screen PHQ 2/9  Decreased Interest 2 2 3   Down, Depressed, Hopeless 1 1 0  PHQ - 2 Score 3 3 3   Altered sleeping 2 0 0  Tired, decreased energy 1 2 0  Change in appetite 2 0 0  Feeling bad or failure about yourself  0 0 0  Trouble concentrating 0 1 0  Moving slowly or fidgety/restless 2 1 1   Suicidal thoughts 0 0 0  PHQ-9 Score 10 7 4        Latest Ref Rng & Units 10/14/2022    9:50 AM  CMP  Glucose 70 - 99 mg/dL 97   BUN 6 - 20 mg/dL 17   Creatinine 8.29 - 1.27 mg/dL 5.62   Sodium 130 - 865 mmol/L 139   Potassium 3.5 - 5.2 mmol/L 4.4   Chloride 96 - 106 mmol/L 102   CO2 20 - 29 mmol/L 20   Calcium 8.7 - 10.2 mg/dL 9.8   Total Protein 6.0 - 8.5 g/dL 7.6   Total Bilirubin 0.0 - 1.2 mg/dL 0.7   Alkaline Phos 44 - 121 IU/L 78   AST 0 - 40 IU/L 21   ALT 0 - 44 IU/L 28    Lipid Panel  No results found for: "CHOL", "TRIG", "HDL", "CHOLHDL", "VLDL", "LDLCALC", "LDLDIRECT"  CBC    Component Value Date/Time   WBC 5.3 10/14/2022 0950   RBC 6.20 (H) 10/14/2022 0950    HGB 17.3 10/14/2022 0950   HCT 51.7 (H) 10/14/2022 0950   PLT 309 10/14/2022 0950   MCV 83 10/14/2022 0950   MCH 27.9 10/14/2022 0950   MCHC 33.5 10/14/2022 0950   RDW 13.1 10/14/2022 0950    ASSESSMENT AND PLAN: 1. GAD (generalized anxiety disorder) (Primary) Patient agreeable to restarting Zoloft and hydroxyzine.  Given that he has been off of the Zoloft for several months, I told him that we will need to restart at the lower dose of 25 mg daily for 1 month then increase it to 50 mg daily.  Refill sent on hydroxyzine to use as needed. - sertraline (ZOLOFT) 50 MG tablet; Take 0.5 tablets (25 mg total) by mouth daily for 30 days, THEN 1 tablet (50 mg total) daily.  Dispense: 45 tablet; Refill: 0 - hydrOXYzine (VISTARIL) 25 MG capsule; Take 1 capsule (25 mg total) by mouth 2 (two) times daily as needed.  Dispense: 60 capsule; Refill: 3  2. Encounter for immunization - Flu vaccine trivalent PF, 6mos and older(Flulaval,Afluria,Fluarix,Fluzone) - Tdap vaccine greater than or equal to 7yo IM  3. Screening for HIV (human immunodeficiency virus) Agreeable to HIV screening and hepatitis C screening. - HIV antibody (with reflex)  4. Need for hepatitis C screening test - HCV Ab w Reflex to Quant PCR    Patient was given the opportunity to ask questions.  Patient verbalized understanding of the plan and was able to repeat key elements of the plan.   This documentation was completed using Paediatric nurse.  Any transcriptional errors are unintentional.  Orders Placed This Encounter  Procedures   Flu vaccine trivalent PF, 6mos and older(Flulaval,Afluria,Fluarix,Fluzone)   Tdap vaccine greater than or equal to 7yo IM   HIV antibody (with reflex)   HCV Ab w Reflex to Quant PCR     Requested Prescriptions   Signed Prescriptions Disp Refills   sertraline (ZOLOFT) 50 MG tablet 45 tablet 0    Sig: Take 0.5 tablets (25 mg total) by mouth daily for 30 days, THEN 1 tablet  (50 mg total) daily.   hydrOXYzine (VISTARIL) 25 MG capsule 60 capsule 3    Sig: Take 1 capsule (25 mg total) by mouth 2 (two) times daily as needed.    Return in about 6 months (around 01/09/2024).  [name], MD, FACP

## 2023-07-12 LAB — HCV INTERPRETATION

## 2023-07-12 LAB — HCV AB W REFLEX TO QUANT PCR: HCV Ab: NONREACTIVE

## 2023-07-12 LAB — HIV ANTIBODY (ROUTINE TESTING W REFLEX): HIV Screen 4th Generation wRfx: NONREACTIVE

## 2023-08-05 ENCOUNTER — Other Ambulatory Visit: Payer: Self-pay

## 2023-09-15 ENCOUNTER — Other Ambulatory Visit: Payer: Self-pay | Admitting: Internal Medicine

## 2023-09-15 DIAGNOSIS — F411 Generalized anxiety disorder: Secondary | ICD-10-CM

## 2023-09-16 ENCOUNTER — Other Ambulatory Visit: Payer: Self-pay

## 2023-09-16 MED ORDER — SERTRALINE HCL 50 MG PO TABS
50.0000 mg | ORAL_TABLET | Freq: Every day | ORAL | 0 refills | Status: DC
  Filled 2023-09-16: qty 90, 90d supply, fill #0

## 2023-12-23 ENCOUNTER — Other Ambulatory Visit: Payer: Self-pay

## 2023-12-23 ENCOUNTER — Other Ambulatory Visit: Payer: Self-pay | Admitting: Internal Medicine

## 2023-12-23 DIAGNOSIS — F411 Generalized anxiety disorder: Secondary | ICD-10-CM

## 2023-12-23 MED ORDER — SERTRALINE HCL 50 MG PO TABS
50.0000 mg | ORAL_TABLET | Freq: Every day | ORAL | 0 refills | Status: DC
  Filled 2023-12-23: qty 30, 30d supply, fill #0

## 2023-12-24 ENCOUNTER — Other Ambulatory Visit: Payer: Self-pay

## 2024-01-09 ENCOUNTER — Ambulatory Visit: Payer: Self-pay | Admitting: Internal Medicine

## 2024-01-20 ENCOUNTER — Ambulatory Visit: Payer: Self-pay | Admitting: Dermatology

## 2024-01-28 ENCOUNTER — Other Ambulatory Visit: Payer: Self-pay | Admitting: Internal Medicine

## 2024-01-28 ENCOUNTER — Other Ambulatory Visit: Payer: Self-pay

## 2024-01-28 DIAGNOSIS — F411 Generalized anxiety disorder: Secondary | ICD-10-CM

## 2024-02-04 ENCOUNTER — Other Ambulatory Visit: Payer: Self-pay | Admitting: Internal Medicine

## 2024-02-04 DIAGNOSIS — F411 Generalized anxiety disorder: Secondary | ICD-10-CM

## 2024-02-05 ENCOUNTER — Other Ambulatory Visit: Payer: Self-pay

## 2024-02-05 MED ORDER — SERTRALINE HCL 50 MG PO TABS
50.0000 mg | ORAL_TABLET | Freq: Every day | ORAL | 0 refills | Status: AC
  Filled 2024-02-05: qty 30, 30d supply, fill #0

## 2024-02-06 ENCOUNTER — Other Ambulatory Visit: Payer: Self-pay

## 2024-03-18 ENCOUNTER — Other Ambulatory Visit: Payer: Self-pay | Admitting: Internal Medicine

## 2024-03-18 DIAGNOSIS — F411 Generalized anxiety disorder: Secondary | ICD-10-CM

## 2024-03-19 ENCOUNTER — Other Ambulatory Visit: Payer: Self-pay

## 2024-03-29 ENCOUNTER — Other Ambulatory Visit: Payer: Self-pay | Admitting: Internal Medicine

## 2024-03-29 DIAGNOSIS — F411 Generalized anxiety disorder: Secondary | ICD-10-CM

## 2024-03-30 ENCOUNTER — Other Ambulatory Visit: Payer: Self-pay

## 2024-04-01 ENCOUNTER — Other Ambulatory Visit: Payer: Self-pay

## 2024-04-01 ENCOUNTER — Encounter (HOSPITAL_COMMUNITY): Payer: Self-pay
